# Patient Record
Sex: Female | Born: 1950 | Race: White | Hispanic: No | Marital: Single | Smoking: Never smoker
Health system: Southern US, Community
[De-identification: ages and names within clinical notes are randomized; demographics above are authoritative.]

## PROBLEM LIST (undated history)

## (undated) DIAGNOSIS — I10 Essential (primary) hypertension: Secondary | ICD-10-CM

## (undated) DIAGNOSIS — E7800 Pure hypercholesterolemia, unspecified: Secondary | ICD-10-CM

## (undated) DIAGNOSIS — E0590 Thyrotoxicosis, unspecified without thyrotoxic crisis or storm: Secondary | ICD-10-CM

## (undated) HISTORY — PX: OOPHORECTOMY: SHX86

## (undated) HISTORY — PX: REPLACEMENT TOTAL KNEE BILATERAL: SUR1225

## (undated) HISTORY — PX: CATARACT EXTRACTION: SUR2

---

## 2021-07-25 ENCOUNTER — Other Ambulatory Visit (HOSPITAL_COMMUNITY): Payer: Self-pay | Admitting: Orthopedic Surgery

## 2021-07-25 ENCOUNTER — Other Ambulatory Visit: Payer: Self-pay | Admitting: Orthopedic Surgery

## 2021-07-25 DIAGNOSIS — Z96652 Presence of left artificial knee joint: Secondary | ICD-10-CM

## 2021-07-26 ENCOUNTER — Ambulatory Visit (HOSPITAL_COMMUNITY)
Admission: RE | Admit: 2021-07-26 | Discharge: 2021-07-26 | Disposition: A | Discharge status: Home / Self Care | Payer: Medicare Other | Source: Ambulatory Visit | Attending: Orthopedic Surgery | Admitting: Orthopedic Surgery

## 2021-07-26 ENCOUNTER — Other Ambulatory Visit (HOSPITAL_COMMUNITY): Payer: Self-pay | Admitting: Orthopedic Surgery

## 2021-07-26 DIAGNOSIS — M7989 Other specified soft tissue disorders: Secondary | ICD-10-CM | POA: Diagnosis present

## 2021-07-26 DIAGNOSIS — Z96652 Presence of left artificial knee joint: Secondary | ICD-10-CM

## 2021-07-26 DIAGNOSIS — M79605 Pain in left leg: Secondary | ICD-10-CM

## 2021-07-31 ENCOUNTER — Other Ambulatory Visit (HOSPITAL_COMMUNITY): Payer: Self-pay

## 2021-10-26 ENCOUNTER — Other Ambulatory Visit: Payer: Self-pay | Admitting: Orthopedic Surgery

## 2021-10-26 ENCOUNTER — Other Ambulatory Visit (HOSPITAL_COMMUNITY): Payer: Self-pay | Admitting: Orthopedic Surgery

## 2021-10-26 DIAGNOSIS — M7989 Other specified soft tissue disorders: Secondary | ICD-10-CM

## 2021-10-27 ENCOUNTER — Ambulatory Visit (HOSPITAL_COMMUNITY)
Admission: RE | Admit: 2021-10-27 | Discharge: 2021-10-27 | Disposition: A | Discharge status: Home / Self Care | Payer: Medicare Other | Source: Ambulatory Visit | Attending: Orthopedic Surgery | Admitting: Orthopedic Surgery

## 2021-10-27 DIAGNOSIS — M7989 Other specified soft tissue disorders: Secondary | ICD-10-CM | POA: Diagnosis present

## 2021-10-31 ENCOUNTER — Ambulatory Visit (HOSPITAL_COMMUNITY): Payer: Medicare Other

## 2021-12-25 ENCOUNTER — Ambulatory Visit: Payer: Medicare Other

## 2022-08-27 ENCOUNTER — Ambulatory Visit
Admission: RE | Admit: 2022-08-27 | Discharge: 2022-08-27 | Disposition: A | Discharge status: Home / Self Care | Payer: Medicare Other | Source: Ambulatory Visit

## 2022-08-27 VITALS — BP 133/84 | HR 87 | Temp 99.5°F | Resp 20

## 2022-08-27 DIAGNOSIS — J069 Acute upper respiratory infection, unspecified: Secondary | ICD-10-CM | POA: Diagnosis present

## 2022-08-27 DIAGNOSIS — Z1152 Encounter for screening for COVID-19: Secondary | ICD-10-CM | POA: Insufficient documentation

## 2022-08-27 HISTORY — DX: Essential (primary) hypertension: I10

## 2022-08-27 HISTORY — DX: Pure hypercholesterolemia, unspecified: E78.00

## 2022-08-27 HISTORY — DX: Thyrotoxicosis, unspecified without thyrotoxic crisis or storm: E05.90

## 2022-08-27 MED ORDER — AZITHROMYCIN 250 MG PO TABS: ORAL_TABLET | ORAL | 0 refills | Status: DC

## 2022-08-27 MED ORDER — PROMETHAZINE-DM 6.25-15 MG/5ML PO SYRP: 5.0000 mL | ORAL_SOLUTION | Freq: Four times a day (QID) | ORAL | 0 refills | Status: DC | PRN

## 2022-08-27 NOTE — Discharge Instructions (Signed)
We have tested you for COVID today, this should be back in the morning and someone will call if you are positive.  Use Flonase nasal spray twice daily, saline sinus rinses, Mucinex and over-the-counter cold and congestion medications.  I have sent over a good cough syrup for you.  If you are worsening over the next 4 to 5 days you may start the antibiotic that I have sent additionally.  Follow-up for significantly worsening symptoms.

## 2022-08-27 NOTE — ED Provider Notes (Signed)
[facility]    CSN: [id number] Arrival date & time: 08/27/22  1050      History   Chief Complaint Chief Complaint  Patient presents with   Cough    I think I probably have a sinus infection. I've had a cough for several days with sore throat and sinus drainage. - Entered by patient    HPI Melissa Dillon is a 72 y.o. female.   Patient presenting today with several weeks of cough that is now progressing into 3 days of sore throat, postnasal drainage, fatigue, more of a wet cough.  Denies fever, chills, chest pain, shortness of breath, abdominal pain, nausea vomiting or diarrhea.  Tried DayQuil with minimal relief so far.  Multiple sick contacts with similar symptoms who now have pneumonia.  No known chronic pulmonary disease.    Past Medical History:  Diagnosis Date   High cholesterol    Hypertension    Hyperthyroidism determined by thyroid function test     There are no problems to display for this patient.   History reviewed. No pertinent surgical history.  OB History   No obstetric history on file.      Home Medications    Prior to Admission medications   Medication Sig Start Date End Date Taking? Authorizing Provider  azithromycin (ZITHROMAX) 250 MG tablet Take first 2 tablets together, then 1 every day until finished. 08/27/22  Yes Particia Nearing, PA-C  buPROPion (WELLBUTRIN XL) 300 MG 24 hr tablet Take by mouth. 04/25/11  Yes [provider]  latanoprost (XALATAN) 0.005 % ophthalmic solution INSTILL 1 DROP IN [facility] EVERY NIGHT AT BEDTIME 12/05/20  Yes [provider]  Lifitegrast Benay Spice) 5 % SOLN Apply to eye. 03/12/18  Yes [provider]  loratadine (CLARITIN) 10 MG tablet Take by mouth. 04/30/11  Yes [provider]  omeprazole (PRILOSEC) 40 MG capsule  11/10/20  Yes [provider]  promethazine-dextromethorphan (PROMETHAZINE-DM) 6.25-15 MG/5ML syrup Take 5 mLs by mouth 4 (four) times daily as  needed. 08/27/22  Yes Particia Nearing, PA-C  lisinopril (ZESTRIL) 10 MG tablet Take 10 mg by mouth daily.    [provider]  Olopatadine HCl 0.2 % SOLN one drop daily.    [provider]  rosuvastatin (CRESTOR) 10 MG tablet Take 1 tablet by mouth daily.    [provider]  rosuvastatin (CRESTOR) 20 MG tablet Take by mouth.    [provider]    Family History History reviewed. No pertinent family history.  Social History Social History   Tobacco Use   Smoking status: Never   Smokeless tobacco: Never  Vaping Use   Vaping Use: Former  Substance Use Topics   Alcohol use: Yes     Allergies   Clonidine derivatives and Codeine   Review of Systems Review of Systems HPI  Physical Exam Triage Vital Signs ED Triage Vitals  Enc Vitals Group     BP 08/27/22 1122 133/84     Pulse Rate 08/27/22 1122 87     Resp 08/27/22 1122 20     Temp 08/27/22 1122 99.5 F (37.5 C)     Temp Source 08/27/22 1122 Oral     SpO2 08/27/22 1122 95 %     Weight --      Height --      Head Circumference --      Peak Flow --      Pain Score 08/27/22 1125 0     Pain  Loc --      Pain Edu? --      Excl. in GC? --    No data found.  Updated Vital Signs BP 133/84 (BP Location: Right Arm)   Pulse 87   Temp 99.5 F (37.5 C) (Oral)   Resp 20   SpO2 95%   Visual Acuity Right Eye Distance:   Left Eye Distance:   Bilateral Distance:    Right Eye Near:   Left Eye Near:    Bilateral Near:     Physical Exam Vitals and nursing note reviewed.  Constitutional:      Appearance: Normal appearance.  HENT:     Head: Atraumatic.     Right Ear: Tympanic membrane and external ear normal.     Left Ear: Tympanic membrane and external ear normal.     Nose: Congestion present.     Mouth/Throat:     Mouth: Mucous membranes are moist.     Pharynx: Posterior oropharyngeal erythema present.  Eyes:     Extraocular Movements: Extraocular movements intact.      Conjunctiva/sclera: Conjunctivae normal.  Cardiovascular:     Rate and Rhythm: Normal rate and regular rhythm.     Heart sounds: Normal heart sounds.  Pulmonary:     Effort: Pulmonary effort is normal.     Breath sounds: Normal breath sounds. No wheezing or rales.  Musculoskeletal:        General: Normal range of motion.     Cervical back: Normal range of motion and neck supple.  Skin:    General: Skin is warm and dry.  Neurological:     Mental Status: She is alert and oriented to person, place, and time.  Psychiatric:        Mood and Affect: Mood normal.        Thought Content: Thought content normal.      UC Treatments / Results  Labs (all labs ordered are listed, but only abnormal results are displayed) Labs Reviewed  SARS CORONAVIRUS 2 (TAT 6-24 HRS)    EKG   Radiology No results found.  Procedures Procedures (including critical care time)  Medications Ordered in UC Medications - No data to display  Initial Impression / Assessment and Plan / UC Course  I have reviewed the triage vital signs and the nursing notes.  Pertinent labs & imaging results that were available during my care of the patient were reviewed by me and considered in my medical decision making (see chart for details).     Suspect viral upper respiratory infection in addition to ongoing cough which patient thinks is more related to her lisinopril.  COVID testing pending, will treat with Phenergan DM, DayQuil, NyQuil, Mucinex and other supportive measures while awaiting these results and adjust if needed.  Zithromax sent in case worsening as she is very concerned about pneumonia.  Return for worsening symptoms.  Final Clinical Impressions(s) / UC Diagnoses   Final diagnoses:  Upper respiratory tract infection, unspecified type     Discharge Instructions      We have tested you for COVID today, this should be back in the morning and someone will call if you are positive.  Use Flonase nasal  spray twice daily, saline sinus rinses, Mucinex and over-the-counter cold and congestion medications.  I have sent over a good cough syrup for you.  If you are worsening over the next 4 to 5 days you may start the antibiotic that I have sent additionally.  Follow-up for significantly  worsening symptoms.    ED Prescriptions     Medication Sig Dispense Auth. Provider   azithromycin (ZITHROMAX) 250 MG tablet Take first 2 tablets together, then 1 every day until finished. 6 tablet Particia Nearing, [place]   promethazine-dextromethorphan (PROMETHAZINE-DM) 6.25-15 MG/5ML syrup Take 5 mLs by mouth 4 (four) times daily as needed. 100 mL Particia Nearing, [place]      PDMP not reviewed this encounter.   Particia Nearing, [place] 08/27/22 1201

## 2022-08-27 NOTE — ED Triage Notes (Signed)
Pt reports she has had a cough x 2 weeks sore throat, drainage, and fatigue x 3 days.   Pt is on lisinopril.  Took nyquil

## 2022-08-28 LAB — SARS CORONAVIRUS 2 (TAT 6-24 HRS): SARS Coronavirus 2: NEGATIVE

## 2023-04-17 IMAGING — US US EXTREM LOW VENOUS*L*
1 series · 13 of 24 positions shown · non-contrast
Comparison: None Available.

CLINICAL DATA: Left lower extremity swelling 8 week status post
left total knee arthroplasty



[Series 1: us venous img lower uni left (dvt) · portal-venous · 13 of 44 slices shown]
[im 1/44]
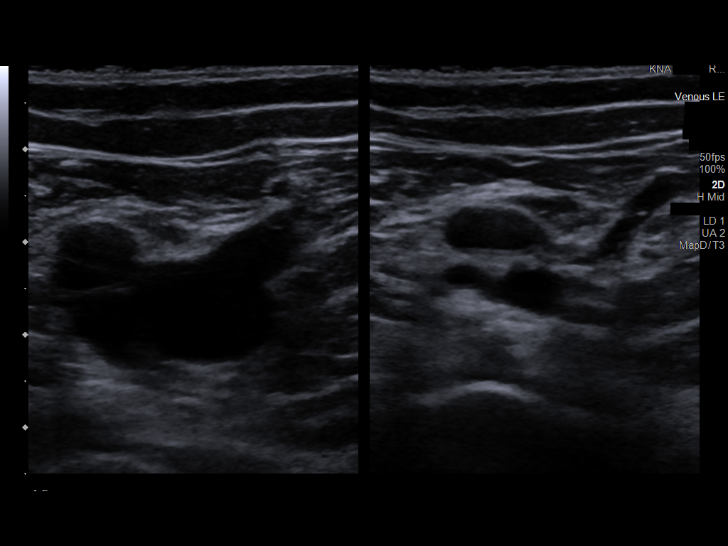
[im 4/44]
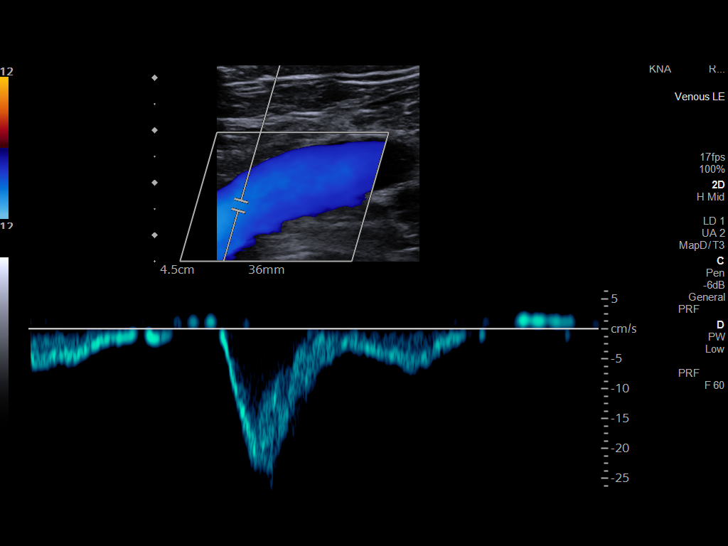
[im 8/44]
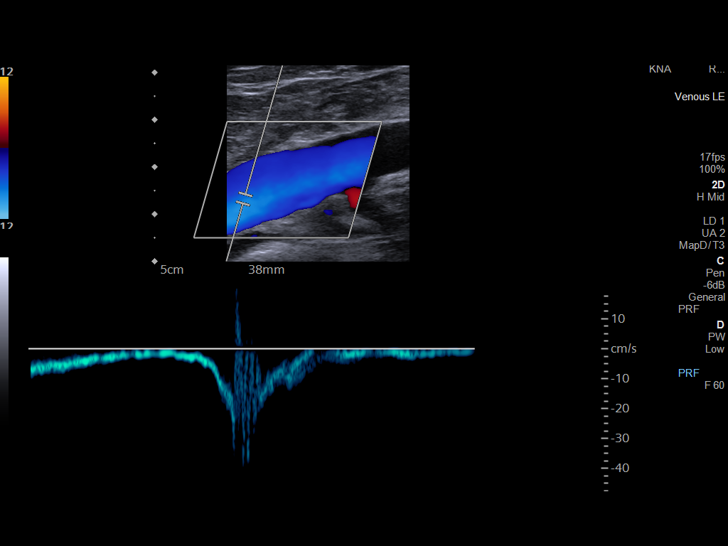
[im 12/44]
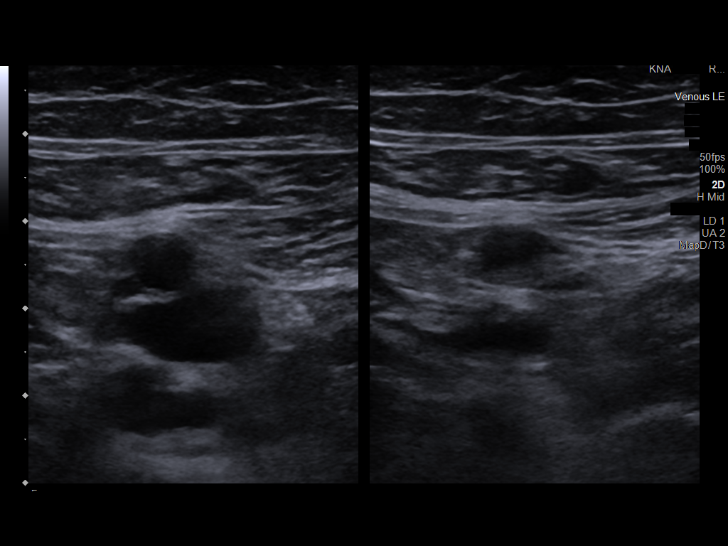
[im 15/44]
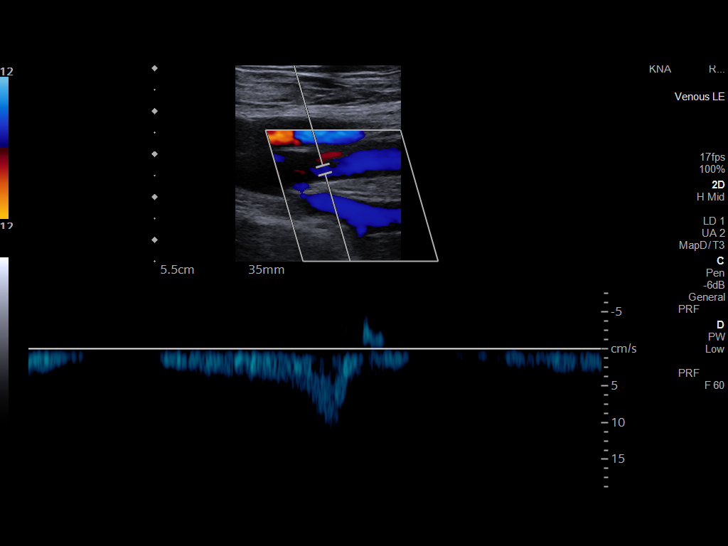
[im 19/44]
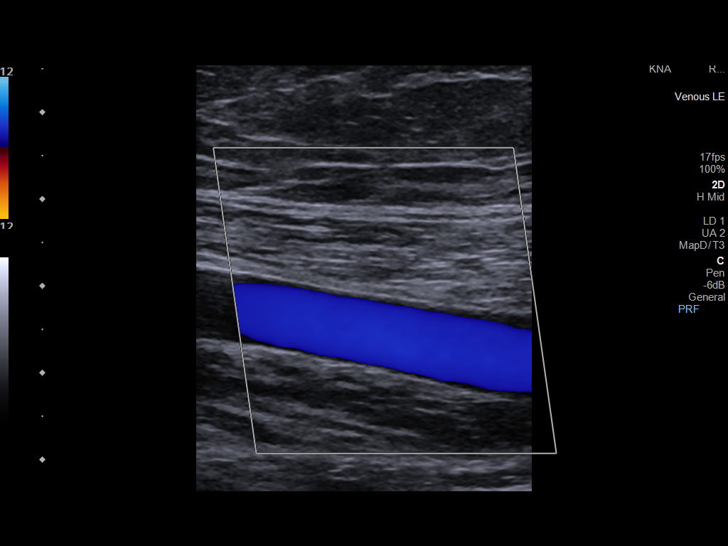
[im 23/44]
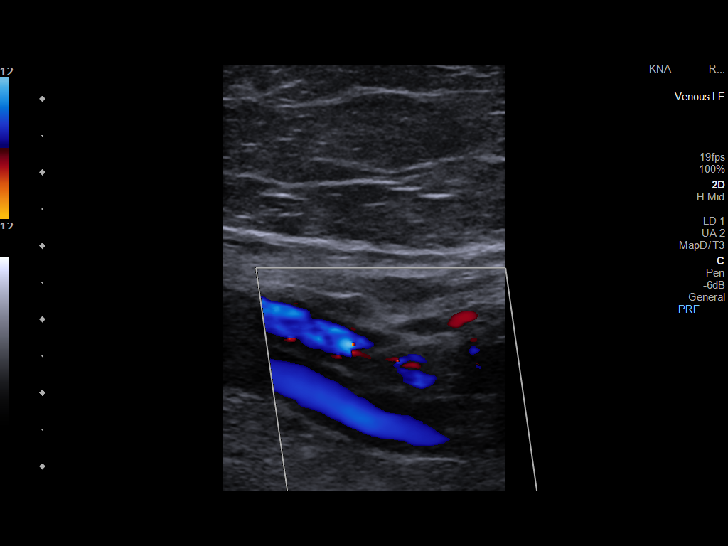
[im 25/44]
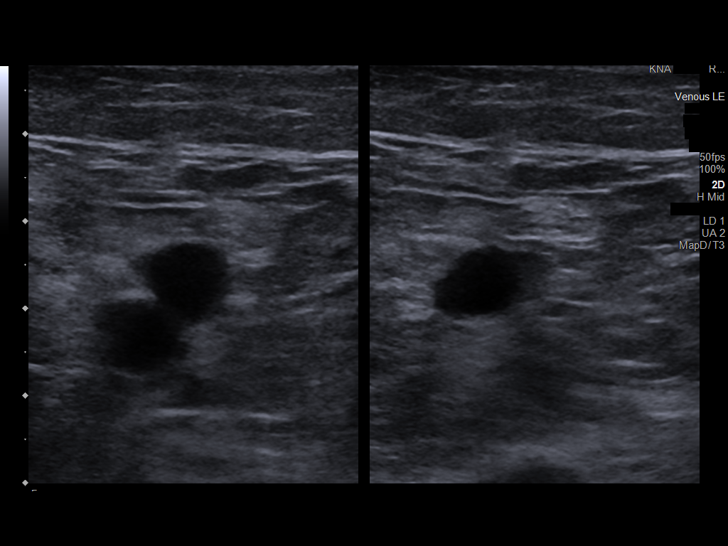
[im 29/44]
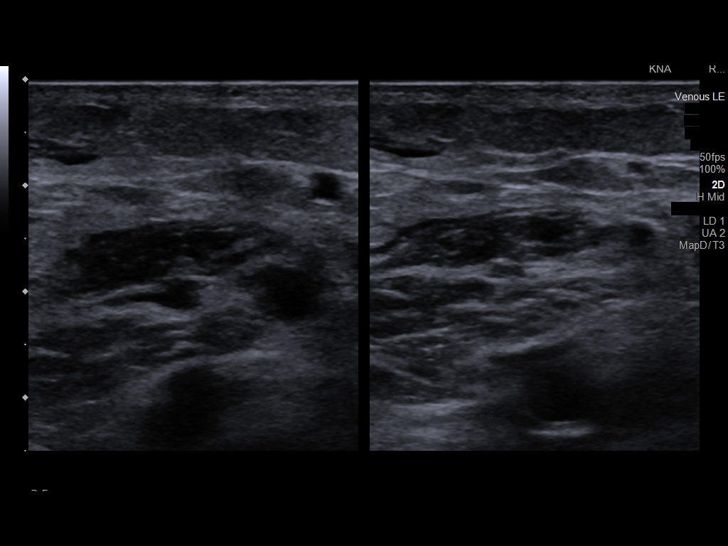
[im 32/44]
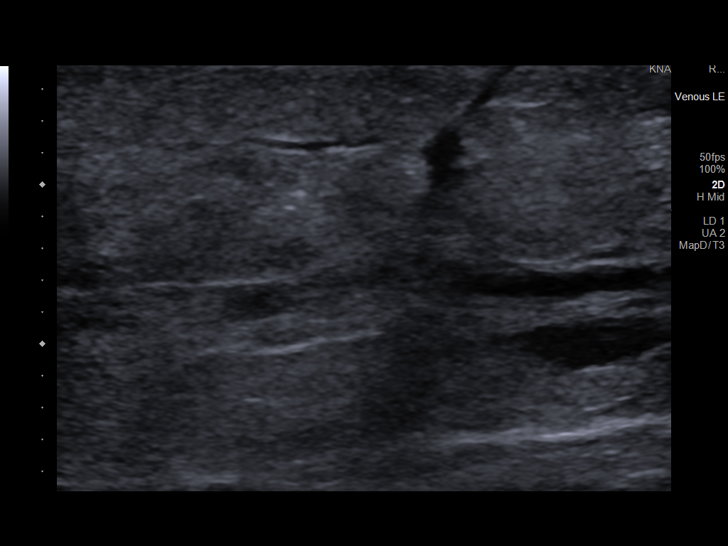
[im 36/44]
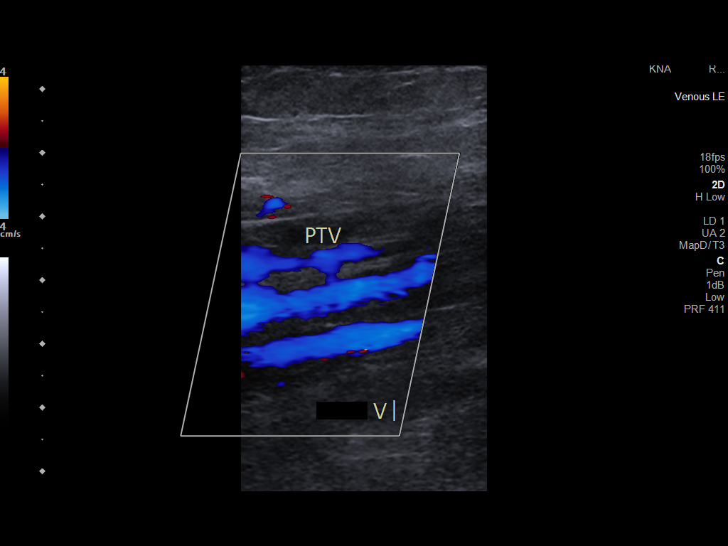
[im 40/44]
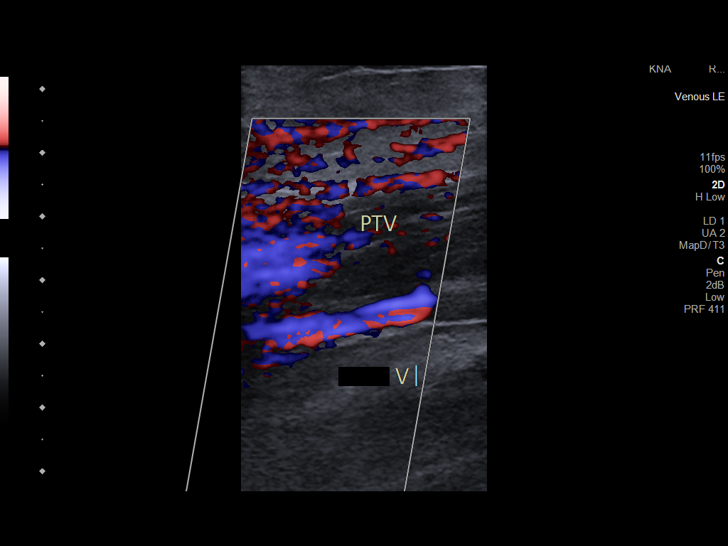
[im 44/44]
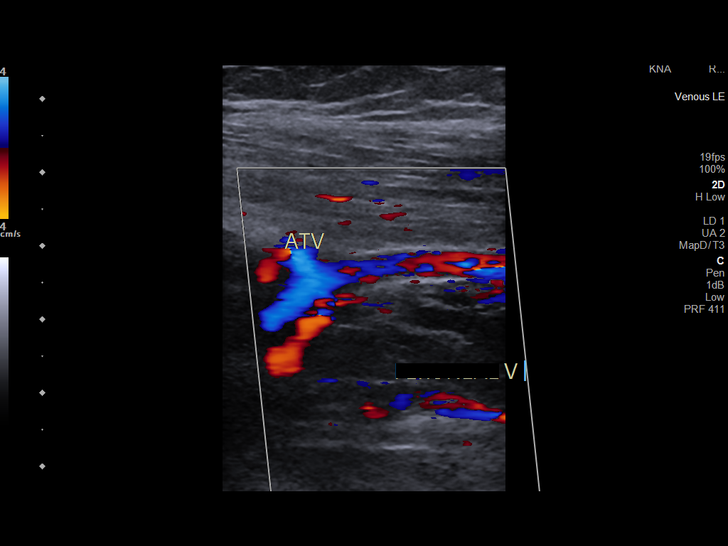

[13 of 24 positions shown; findings below may reference images not displayed]

FINDINGS: Contralateral Common Femoral Vein: Respiratory phasicity is normal
and symmetric with the symptomatic side. No evidence of thrombus.
Normal compressibility.

Common Femoral Vein: No evidence of thrombus. Normal
compressibility, respiratory phasicity and response to augmentation.

Saphenofemoral Junction: No evidence of thrombus. Normal
compressibility and flow on color Doppler imaging.

Profunda Femoral Vein: No evidence of thrombus. Normal
compressibility and flow on color Doppler imaging.

Femoral Vein: No evidence of thrombus. Normal compressibility,
respiratory phasicity and response to augmentation.

Popliteal Vein: No evidence of thrombus. Normal compressibility,
respiratory phasicity and response to augmentation.

Calf Veins: Posterior tibial veins are patent and compressible.
However, 1 of the paired peroneal veins this not appear compressible
and lumen is expanded and contains low level internal echoes without
evidence of color flow on color Doppler imaging. Findings are
consistent with isolated calf DVT.

Superficial Great Saphenous Vein: No evidence of thrombus. Normal
compressibility.

Venous Reflux:  None.

Other Findings:  None.
IMPRESSION: Positive for isolated calf DVT involving 1 of the paired peroneal
veins.

## 2023-07-22 ENCOUNTER — Encounter: Payer: Self-pay | Admitting: Cardiology

## 2023-07-22 ENCOUNTER — Ambulatory Visit: Attending: Cardiology | Admitting: Cardiology

## 2023-07-22 VITALS — BP 118/76 | Ht 63.0 in | Wt 219.4 lb

## 2023-07-22 DIAGNOSIS — R0609 Other forms of dyspnea: Secondary | ICD-10-CM | POA: Insufficient documentation

## 2023-07-22 DIAGNOSIS — E782 Mixed hyperlipidemia: Secondary | ICD-10-CM | POA: Diagnosis not present

## 2023-07-22 DIAGNOSIS — I499 Cardiac arrhythmia, unspecified: Secondary | ICD-10-CM | POA: Insufficient documentation

## 2023-07-22 NOTE — Patient Instructions (Addendum)
 Medication Instructions:  STOP Eliquis   *If you need a refill on your cardiac medications before your next appointment, please call your pharmacy*  Lab Work: Lipid panel after 30 day monitor   If you have labs (blood work) drawn today and your tests are completely normal, you will receive your results only by: MyChart Message (if you have MyChart) OR A paper copy in the mail If you have any lab test that is abnormal or we need to change your treatment, we will call you to review the results.  Testing/Procedures: Echo after 30 day monitor   Your physician has requested that you have an echocardiogram. Echocardiography is a painless test that uses sound waves to create images of your heart. It provides your doctor with information about the size and shape of your heart and how well your heart's chambers and valves are working. This procedure takes approximately one hour. There are no restrictions for this procedure. Please do NOT wear cologne, perfume, aftershave, or lotions (deodorant is allowed). Please arrive 15 minutes prior to your appointment time.  Please note: We ask at that you not bring children with you during ultrasound (echo/ vascular) testing. Due to room size and safety concerns, children are not allowed in the ultrasound rooms during exams. Our front office staff cannot provide observation of children in our lobby area while testing is being conducted. An adult accompanying a patient to their appointment will only be allowed in the ultrasound room at the discretion of the ultrasound technician under special circumstances. We apologize for any inconvenience.   EXERCISE NUCLEAR STRESS TEST AFTER 30 DAY MONITOR   Your physician has requested that you have en exercise stress myoview. For further information please visit [email or web address] Please follow instruction sheet, as given.   30 DAY EVENT MONITOR   Your physician has recommended that you wear an event monitor. Event  monitors are medical devices that record the heart's electrical activity. Doctors most often us  these monitors to diagnose arrhythmias. Arrhythmias are problems with the speed or rhythm of the heartbeat. The monitor is a small, portable device. You can wear one while you do your normal daily activities. This is usually used to diagnose what is causing palpitations/syncope (passing out).   Follow-Up: At [facility], you and your health needs are our priority.  As part of our continuing mission to provide you with exceptional heart care, our providers are all part of one team.  This team includes your primary Cardiologist (physician) and Advanced Practice Providers or APPs (Physician Assistants and Nurse Practitioners) who all work together to provide you with the care you need, when you need it.  Your next appointment:   3 month(s)  Provider:   One of our Advanced Practice Providers (APPs): Melita Springer, PA-C  Friddie Jetty, NP Evaline Hill, NP  Theotis Flake, PA-C Lawana Pray, NP  Willis Harter, PA-C Lovette Rud, PA-C  [place], [place] Charles Connor, NP  Marlana Silvan, NP Marcie Sever, PA-C  Laquita Plant, PA-C    Dayna Dunn, PA-C  Marlyse Single, PA-C Palmer Bobo, NP Katlyn West, NP Callie Goodrich, PA-C  Evan Williams, PA-C Sheng Haley, PA-C  Xika Zhao, NP Kathleen Johnson, PA-C

## 2023-07-22 NOTE — Progress Notes (Signed)
 Cardiology Office Note:  .   Date:  07/22/2023  ID:  Melissa Dillon, DOB 1951-02-03, MRN [id number] PCP: Pcp, No  [facility] Providers Cardiologist:  Fransico Ivy, MD PCP: Pcp, No  Chief Complaint  Patient presents with   Irregular Heart Beat     Melissa Dillon is a 73 y.o. female with hyperlipidemia, OSA on CPAP, irregular heartbeat, exertional dyspnea  Discussed the use of AI scribe software for clinical note transcription with the patient, who gave verbal consent to proceed.  History of Present Illness Melissa Dillon is a 73 year old female with concerns about AFib detected on her Fitbit. She was referred by her primary care physician for evaluation of atrial fibrillation.  Episodes of atrial fibrillation have been detected on her Fitbit on three separate nights over the past month, with no associated symptoms such as chest pain, shortness of breath, heart racing, or palpitations. She has not had atrial fibrillation confirmed on an EKG. She experiences intermittent shortness of breath, primarily when climbing stairs, which she attributes to being overweight. She is currently taking Eliquis, having started four doses ago after receiving a sample from her primary care physician.  Her family history includes significant heart disease, with her father having had atrial fibrillation, triple bypass surgery, a major stroke, and heart failure. Her sister also has atrial fibrillation.  She uses a CPAP machine regularly for sleep apnea. She consumes coffee daily and alcohol once or twice a week, but does not smoke.      Vitals:   07/22/23 0959 07/22/23 1000  BP: 124/78 118/76  SpO2: 98%       Review of Systems  Cardiovascular:  Positive for dyspnea on exertion. Negative for chest pain, leg swelling, palpitations and syncope.        Studies Reviewed: Melissa Dillon        EKG 07/22/2023: Normal sinus rhythm w/occasional PAC Borderline ECG No previous ECGs available     Independently interpreted 06/2023: Cr 0.83  11/2022: Chol 207, TG 158, HDL 65, LDL 115 Hb 15  Physical Exam Vitals and nursing note reviewed.  Constitutional:      General: She is not in acute distress. Neck:     Vascular: No JVD.  Cardiovascular:     Rate and Rhythm: Normal rate and regular rhythm.     Heart sounds: Normal heart sounds. No murmur heard. Pulmonary:     Effort: Pulmonary effort is normal.     Breath sounds: Normal breath sounds. No wheezing or rales.  Musculoskeletal:     Right lower leg: No edema.     Left lower leg: No edema.      VISIT DIAGNOSES:   ICD-10-CM   1. Irregular heart beat  I49.9 EKG 12-Lead    Cardiac event monitor    2. Exertional dyspnea  R06.09 ECHOCARDIOGRAM COMPLETE    Myocardial Perfusion Imaging    Cardiac Stress Test: Informed Consent Details: Physician/Practitioner Attestation; Transcribe to consent form and obtain patient signature    3. Mixed hyperlipidemia  E78.2 Lipid panel    Lipid panel       Melissa Dillon is a 73 y.o. female with hyperlipidemia, OSA on CPAP, irregular heartbeat, exertional dyspnea Assessment & Plan Irregular heartbeat: Reported A-fib on Fitbit, not confirmed on EKG. Possible Fitbit misinterpretation due to extra beat. Moderate stroke risk due to age and gender, so she will need anticoagulation with Eliquis if AFib confirmed.  However, in absence of EKG evidence of A-fib, hold Eliquis for now. -  Provide 30-day event monitor for accurate AFib detection. - Order echocardiogram to assess cardiac function. - Order stress test to evaluate for coronary artery disease. - Continue walking, aim for 10,000 steps most days.  Obstructive sleep apnea Continue CPAP therapy.  Mixed hyperlipidemia: Cholesterol slightly elevated. On rosuvastatin 20 mg for four years. Dose increase may improve management. - Increase Crestor to 40 mg. - Check cholesterol levels in July, preferably fasting.  Follow-up: Plan for  monitoring and further testing discussed. - Start monitor today for 30 days. - Return monitor in July, perform echocardiogram, stress test, and lab work at the same time. - Schedule follow-up appointment in three months, around September.      Informed Consent   Shared Decision Making/Informed Consent The risks [chest pain, shortness of breath, cardiac arrhythmias, dizziness, blood pressure fluctuations, myocardial infarction, stroke/transient ischemic attack, nausea, vomiting, allergic reaction, radiation exposure, metallic taste sensation and life-threatening complications (estimated to be 1 in 10,000)], benefits (risk stratification, diagnosing coronary artery disease, treatment guidance) and alternatives of a nuclear stress test were discussed in detail with Melissa Dillon and she agrees to proceed.      \  F/u in 3 months  Signed, Cody Das, MD

## 2023-07-23 ENCOUNTER — Encounter: Payer: Self-pay | Admitting: Cardiology

## 2023-08-13 ENCOUNTER — Other Ambulatory Visit: Payer: Self-pay | Admitting: Cardiology

## 2023-08-13 DIAGNOSIS — R0609 Other forms of dyspnea: Secondary | ICD-10-CM

## 2023-08-26 ENCOUNTER — Telehealth: Payer: Self-pay | Admitting: *Deleted

## 2023-08-26 MED ORDER — APIXABAN 5 MG PO TABS: 5.0000 mg | ORAL_TABLET | Freq: Two times a day (BID) | ORAL | 0 refills | Status: AC

## 2023-08-26 MED ORDER — APIXABAN 5 MG PO TABS: 5.0000 mg | ORAL_TABLET | Freq: Two times a day (BID) | ORAL | 3 refills | Status: AC

## 2023-08-26 NOTE — Addendum Note (Signed)
 Addended by: GRETEL MAEOLA CROME on: 08/26/2023 10:27 AM   Modules accepted: Orders

## 2023-08-26 NOTE — Telephone Encounter (Signed)
   Cardiac Monitor Alert  Date of alert:  08/26/2023   Patient Name: Melissa Dillon  DOB: April 09, 1950  MRN: [id number]   [facility] Cardiologist: Newman JINNY Lawrence, MD  [facility] EP:  None    Monitor Information: Cardiac Event Monitor [[employer]]  Reason:  Irregular heart beat/palpitations Ordering provider:  Patwardhan   Alert Atrial Fibrillation/Flutter This is the 1st alert for this rhythm.  (Day 23 of 30) The patient has no hx of Atrial Fibrillation/Flutter.  The patient is not currently on anticoagulation.  Next Cardiology Appointment   Date:  9/3  Provider:  Lelon  The patient was contacted today.  She is asymptomatic. Arrhythmia, symptoms and history reviewed with Dr. Lawrence.  Plan:  complete monitor in the next couple days, restart Eliquis  (she will stop by the office tomorrow for 30 day free card, she will use samples she has at home until she can pick up card/Rx)  Other:   Gretel Maeola CROME, RN  08/26/2023 9:30 AM

## 2023-08-27 ENCOUNTER — Telehealth (HOSPITAL_COMMUNITY): Payer: Self-pay | Admitting: *Deleted

## 2023-08-27 NOTE — Telephone Encounter (Signed)
 Patient given detailed instructions per Myocardial Perfusion Study Information Sheet for the test on 09/04/23 Patient notified to arrive 15 minutes early and that it is imperative to arrive on time for appointment to keep from having the test rescheduled.  If you need to cancel or reschedule your appointment, please call the office within 24 hours of your appointment. . Patient verbalized understanding. Melissa Dillon

## 2023-08-28 ENCOUNTER — Encounter: Payer: Self-pay | Admitting: Cardiology

## 2023-08-30 ENCOUNTER — Ambulatory Visit: Attending: Cardiology

## 2023-08-30 DIAGNOSIS — I499 Cardiac arrhythmia, unspecified: Secondary | ICD-10-CM

## 2023-09-04 ENCOUNTER — Ambulatory Visit (HOSPITAL_COMMUNITY)
Admission: RE | Admit: 2023-09-04 | Discharge: 2023-09-04 | Disposition: A | Discharge status: Home / Self Care | Source: Ambulatory Visit | Attending: Cardiology | Admitting: Cardiology

## 2023-09-04 ENCOUNTER — Ambulatory Visit (HOSPITAL_COMMUNITY)
Admission: RE | Admit: 2023-09-04 | Discharge: 2023-09-04 | Disposition: A | Discharge status: Home / Self Care | Source: Ambulatory Visit | Attending: Cardiovascular Disease | Admitting: Cardiovascular Disease

## 2023-09-04 DIAGNOSIS — R0609 Other forms of dyspnea: Secondary | ICD-10-CM | POA: Diagnosis present

## 2023-09-04 LAB — MYOCARDIAL PERFUSION IMAGING
Angina Index: 0
Base ST Depression (mm): 0 mm
Duke Treadmill Score: 4
Estimated workload: 5.8
Exercise duration (min): 4 min
Exercise duration (sec): 0 s
LV dias vol: 65 mL (ref 46–106)
LV sys vol: 8 mL (ref 3.8–5.2)
MPHR: 149 {beats}/min
Nuc Stress EF: 88 %
Peak HR: 150 {beats}/min
Percent HR: 100 %
Rest HR: 77 {beats}/min
Rest Nuclear Isotope Dose: 10.9 mCi
SDS: 0
SRS: 0
SSS: 0
ST Depression (mm): 0 mm
Stress Nuclear Isotope Dose: 31.8 mCi
TID: 0.88

## 2023-09-04 LAB — ECHOCARDIOGRAM COMPLETE
Area-P 1/2: 3.4 cm2
S' Lateral: 2.68 cm

## 2023-09-04 MED ORDER — TECHNETIUM TC 99M TETROFOSMIN IV KIT
10.9000 | PACK | Freq: Once | INTRAVENOUS | Status: AC | PRN
  Administered 2023-09-04: 10.9 via INTRAVENOUS

## 2023-09-04 MED ORDER — TECHNETIUM TC 99M TETROFOSMIN IV KIT
31.8000 | PACK | Freq: Once | INTRAVENOUS | Status: AC | PRN
  Administered 2023-09-04: 31.8 via INTRAVENOUS

## 2023-09-07 ENCOUNTER — Ambulatory Visit: Payer: Self-pay | Admitting: Cardiology

## 2023-09-08 DIAGNOSIS — I491 Atrial premature depolarization: Secondary | ICD-10-CM

## 2023-09-08 DIAGNOSIS — I499 Cardiac arrhythmia, unspecified: Secondary | ICD-10-CM | POA: Diagnosis not present

## 2023-09-08 NOTE — Progress Notes (Signed)
 Also noted to have ventricular tachycardia. Normal echocardiogram and stress test with no significant abnormalities. Recommend adding metoprolol succinate 25 mg daily. Happy to discuss in person.  Thanks MJP

## 2023-09-08 NOTE — Progress Notes (Signed)
 1% Afib noted. Continue Eliquis . Current appt w/Scott Lelon in September. Happy to see sooner to discuss Afib management.  Thanks MJP

## 2023-09-10 NOTE — Telephone Encounter (Signed)
 Appt moved up and message sent to provider concerning metoprolol dose. Please review result note.

## 2023-09-13 ENCOUNTER — Encounter: Payer: Self-pay | Admitting: Cardiology

## 2023-09-13 MED ORDER — METOPROLOL SUCCINATE ER 25 MG PO TB24: 12.5000 mg | ORAL_TABLET | Freq: Every day | ORAL | 3 refills | Status: AC

## 2023-09-13 NOTE — Telephone Encounter (Signed)
 Spoke with patient and advised that per Dr. Elmira to start metoprolol succinate 12.5 mg daily. Pt agrees with plan of care.

## 2023-09-19 ENCOUNTER — Other Ambulatory Visit (HOSPITAL_BASED_OUTPATIENT_CLINIC_OR_DEPARTMENT_OTHER): Payer: Self-pay

## 2023-09-19 MED ORDER — BOOSTRIX 5-2.5-18.5 LF-MCG/0.5 IM SUSY
0.5000 mL | PREFILLED_SYRINGE | Freq: Once | INTRAMUSCULAR | 0 refills | Status: AC
  Filled 2023-09-19: qty 0.5, 1d supply, fill #0

## 2023-09-20 ENCOUNTER — Encounter: Payer: Self-pay | Admitting: Cardiology

## 2023-09-20 ENCOUNTER — Ambulatory Visit: Attending: Cardiology | Admitting: Cardiology

## 2023-09-20 VITALS — BP 108/65 | HR 71 | Ht 63.0 in | Wt 218.0 lb

## 2023-09-20 DIAGNOSIS — E669 Obesity, unspecified: Secondary | ICD-10-CM

## 2023-09-20 DIAGNOSIS — G4733 Obstructive sleep apnea (adult) (pediatric): Secondary | ICD-10-CM | POA: Diagnosis not present

## 2023-09-20 DIAGNOSIS — E782 Mixed hyperlipidemia: Secondary | ICD-10-CM

## 2023-09-20 DIAGNOSIS — I480 Paroxysmal atrial fibrillation: Secondary | ICD-10-CM

## 2023-09-20 NOTE — Progress Notes (Signed)
 Cardiology Office Note:  .   Date:  09/20/2023  ID:  Melissa Dillon, DOB 1950/05/22, MRN [id number] PCP: Pcp, No  [facility] Providers Cardiologist:  Newman Lawrence, MD PCP: Pcp, No  Chief Complaint  Patient presents with   PAF     Melissa Dillon is a 73 y.o. female with hyperlipidemia, obesity, OSA on CPAP, PAF, AIVR, NSVT  History of Present Illness Reviewed recent echocardiogram stress test, monitor results with the patient, details below. She does not have any clear symptoms of palpitations, presyncope or sycope. AIVR episode occurred during sleep hours. She has OSA, uses CPAP. She is taking eliquis  5 mg bid and metoprolol  succinate 12.5 mg daily without any side effects.    Vitals:   09/20/23 1115  BP: 108/65  Pulse: 71  SpO2: 95%       Review of Systems  Cardiovascular:  Negative for chest pain, dyspnea on exertion, leg swelling, palpitations and syncope.        Studies Reviewed: SABRA        EKG 07/22/2023: Normal sinus rhythm w/occasional PAC Borderline ECG No previous ECGs available    Echocardiogram 08/2023:  1. Left ventricular ejection fraction, by estimation, is 60 to 65%. Left  ventricular ejection fraction by 3D volume is 62 %. The left ventricle has  normal function. The left ventricle has no regional wall motion  abnormalities. Left ventricular diastolic   parameters were normal. The average left ventricular global longitudinal  strain is -19.3 %. The global longitudinal strain is normal.   2. Right ventricular systolic function is normal. The right ventricular  size is normal.   3. The mitral valve is normal in structure. Trivial mitral valve  regurgitation. No evidence of mitral stenosis.   4. The aortic valve is tricuspid. Aortic valve regurgitation is not  visualized. No aortic stenosis is present.   5. The inferior vena cava is normal in size with greater than 50%  respiratory variability, suggesting right atrial pressure of 3  mmHg.   Stress test 08/2023:   A Bruce protocol stress test was performed. The patient experienced no angina during the test. Normal blood pressure and normal heart rate response noted during stress. Heart rate recovery was normal.   No ST deviation was noted. There were no arrhythmias during stress. ECG was interpretable and conclusive. The ECG was negative for ischemia.   LV perfusion is normal. There is no evidence of ischemia. There is no evidence of infarction.   Left ventricular function is normal. Nuclear stress EF: 88%. End diastolic cavity size is normal. End systolic cavity size is normal. No evidence of transient ischemic dilation (TID) noted.   Coronary calcium was present on the attenuation correction CT images. Mild coronary calcifications were present. Coronary calcifications were present in the left circumflex artery distribution(s).   The study is normal. The study is low risk.    Mobile cardiac telemetry 30 days 07/30/2023 - 08/28/2023: Dominant rhythm: Sinus. HR 54-149 bpm. Avg HR 82 bpm. 1% Afib/flutter noted, longest episode for 2 hr 17 min, fastest at 149 bpm. 2 episodes of VT, fastest at 141 bpm, longest for 16 secs (AIVR episode) <1% PVCs. 0 episodes of SVT. <1% PACS. No SVT/high grade AV block, sinus pause >3sec noted. 3 manually detected events correlated with sinus rhythm. Above reported arhythmia did not correlated with patient reported symptoms.  Labs 06/2023: Cr 0.83  11/2022: Chol 207, TG 158, HDL 65, LDL 115 Hb 15  Physical Exam Vitals and  nursing note reviewed.  Constitutional:      General: She is not in acute distress. Neck:     Vascular: No JVD.  Cardiovascular:     Rate and Rhythm: Normal rate and regular rhythm.     Heart sounds: Normal heart sounds. No murmur heard. Pulmonary:     Effort: Pulmonary effort is normal.     Breath sounds: Normal breath sounds. No wheezing or rales.  Musculoskeletal:     Right lower leg: No edema.     Left  lower leg: No edema.      VISIT DIAGNOSES:   ICD-10-CM   1. PAF (paroxysmal atrial fibrillation) (HCC)  I48.0 AMB Referral to [facility] Pharm-D    2. OSA (obstructive sleep apnea)  G47.33 AMB Referral to [facility] Pharm-D    3. Obesity (BMI 30-39.9)  E66.9 AMB Referral to [facility] Pharm-D    4. Mixed hyperlipidemia  E78.2 CT CARDIAC SCORING (SELF PAY ONLY)        Melissa Dillon is a 73 y.o. female with hyperlipidemia, obesity, OSA on CPAP, PAF, AIVR, NSVT Assessment & Plan PAF: 1% Afib burden, no clear symptoms. Recommend rate control approach. Continue metoprolol  succinate 12.5 mg daily. Continue eliquis  5 mg bid.  NSVT/AIVR: NSVT only brief, AIVR episode during sleep hpurs. Likely related to OSA. Continue CPAP.  Obesity: Likely playing a role in her PAF and OSA. Also has hyperlipidemia. Will check calcium score scan to assess for presence of CAD. She will likely benefit from GLP-1 agonists.    F/u in 6 months  Signed, Newman JINNY Lawrence, MD

## 2023-09-20 NOTE — Patient Instructions (Signed)
 Medication Instructions:  Eliquis  patient assistance started   *If you need a refill on your cardiac medications before your next appointment, please call your pharmacy*  Testing/Procedures: CALCIUM SCORE   CT scanning for a cardiac calcium score (CAT scanning), is a noninvasive, special x-ray that produces cross-sectional images of the body using x-rays and a computer. CT scans help physicians diagnose and treat medical conditions. For some CT exams, a contrast material is used to enhance visibility in the area of the body being studied. CT scans provide greater clarity and reveal more details than regular x-ray exams.   Your physician has requested that you have a coronary calcium score performed. This is not covered by insurance and will be an out-of-pocket cost of approximately $99.   REFERRAL PHARM D   Follow-Up: At [facility], you and your health needs are our priority.  As part of our continuing mission to provide you with exceptional heart care, our providers are all part of one team.  This team includes your primary Cardiologist (physician) and Advanced Practice Providers or APPs (Physician Assistants and Nurse Practitioners) who all work together to provide you with the care you need, when you need it.  Your next appointment:   6 month(s)  Provider:   Newman JINNY Lawrence, MD

## 2023-10-07 ENCOUNTER — Ambulatory Visit (HOSPITAL_COMMUNITY)
Admission: RE | Admit: 2023-10-07 | Discharge: 2023-10-07 | Disposition: A | Discharge status: Home / Self Care | Payer: Self-pay | Source: Ambulatory Visit | Attending: Cardiology | Admitting: Cardiology

## 2023-10-07 DIAGNOSIS — E782 Mixed hyperlipidemia: Secondary | ICD-10-CM | POA: Insufficient documentation

## 2023-10-10 ENCOUNTER — Ambulatory Visit: Payer: Self-pay | Admitting: Cardiology

## 2023-10-10 DIAGNOSIS — E782 Mixed hyperlipidemia: Secondary | ICD-10-CM

## 2023-10-10 NOTE — Progress Notes (Signed)
 Recommend Crestor 20 mg daily to reduce progression of coronary artery disease and risk of heart attack.  Thanks MJP

## 2023-10-11 NOTE — Progress Notes (Signed)
 Noted.  If patient is comfortable, increase Crestor to to 40 mg daily for further LDL reduction.  Thanks MJP

## 2023-10-16 NOTE — Progress Notes (Signed)
 Continue 2 tablets for now.  Check lipid panel in 11/2023, unless recommended otherwise by Pharm.D. at upcoming visit.  Thanks MJP

## 2023-10-23 ENCOUNTER — Ambulatory Visit: Admitting: Physician Assistant

## 2023-11-01 ENCOUNTER — Ambulatory Visit: Attending: Cardiology | Admitting: Pharmacist

## 2023-11-01 VITALS — Ht 63.0 in | Wt 223.2 lb

## 2023-11-01 DIAGNOSIS — G4733 Obstructive sleep apnea (adult) (pediatric): Secondary | ICD-10-CM

## 2023-11-01 DIAGNOSIS — E669 Obesity, unspecified: Secondary | ICD-10-CM

## 2023-11-01 MED ORDER — ROSUVASTATIN CALCIUM 40 MG PO TABS: 40.0000 mg | ORAL_TABLET | Freq: Every day | ORAL | 3 refills | Status: AC

## 2023-11-01 NOTE — Progress Notes (Addendum)
 Patient ID: Melissa Dillon                 DOB: 24-Feb-1950                    MRN: [id number]     HPI: Melissa Dillon is a 73 y.o. female patient referred to pharmacy clinic by Melissa Lawrence, MD to initiate GLP1-RA therapy. PMH is significant for  hyperlipidemia, OSA on CPAP, PAF, AIVR, NSVT, and obesity. Most recent BMI 39.55 kg/m .  PCP previously recommended starting a GLP1 for weight loss. Insurance denied coverage at the time.  Previously in a weight loss study at [facility]. She was apart of the diet food group of the study and lost ~30lb, but gained it all back when the trial ended.   Barriers to weight loss is depression. Will find herself eating when experiencing episodes. Currently in therapy and on medications to help with managing her depression.  Baseline weight and BMI: 38.63 kg/m  Current weight and BMI: 39.55 kg/m   Diet: 2-3 meals a day Breakfast: cereal (Rasin Bran, Cheerios, Shredded Wheat), toast Lunch: microwave meals Dinner: leftovers from the meal before, cereal, soup - something easy to make Eats out occasionally - [place], [employer], [employer], [employer] Previously doing [employer] meals and [employer] Does not like to cook Snacking a lot: fruit (blueberries), Doritos, , M&Ms Drinks: water, half and half tea, loves Dr. Nunzio (cut down), 2 times a week alcohol  Exercise: does not like going to the gym Typically walks about 10,000 steps 2-3 days a week. Does not walk as much as she would like  Family History:  Family History  Problem Relation Age of Onset   Multiple myeloma Mother    Hypertension Father    Hyperlipidemia Father    Throat cancer Father    Stroke Father    Migraines Father    Atrial fibrillation Father    Heart failure Father    Atrial fibrillation Sister    Skin cancer Sister    GER disease Sister    Osteopenia Sister     Social History: No tobacco, + ETOH  Labs: No results found for: HGBA1C  Wt Readings from  Last 1 Encounters:  11/01/23 223 lb 3.2 oz (101.2 kg)    BP Readings from Last 1 Encounters:  09/20/23 108/65   Pulse Readings from Last 1 Encounters:  09/20/23 71    No results found for: CHOL, TRIG, HDL, CHOLHDL, VLDL, LDLCALC, LDLDIRECT  Past Medical History:  Diagnosis Date   High cholesterol    Hypertension    Hyperthyroidism determined by thyroid function test     Current Outpatient Medications on File Prior to Visit  Medication Sig Dispense Refill   albuterol (VENTOLIN HFA) 108 (90 Base) MCG/ACT inhaler Inhale 1-2 puffs into the lungs every 6 (six) hours as needed.     apixaban  (ELIQUIS ) 5 MG TABS tablet Take 1 tablet (5 mg total) by mouth 2 (two) times daily. 60 tablet 3   apixaban  (ELIQUIS ) 5 MG TABS tablet Take 1 tablet (5 mg total) by mouth 2 (two) times daily. 28 tablet 0   Ascorbic Acid (VITAMIN C) 500 MG CHEW daily at 6 (six) AM.     azelastine (ASTELIN) 0.1 % nasal spray Place 2 sprays into both nostrils 2 (two) times daily. As needed     buPROPion (WELLBUTRIN XL) 300 MG 24 hr tablet Take by mouth.     cholecalciferol (VITAMIN D3) 25  MCG (1000 UNIT) tablet Take 1,000 Units by mouth daily.     escitalopram (LEXAPRO) 5 MG tablet Take 5 mg by mouth daily.     fluticasone (FLONASE) 50 MCG/ACT nasal spray Place 1 spray into both nostrils as needed for allergies.     latanoprost (XALATAN) 0.005 % ophthalmic solution INSTILL 1 DROP IN EACH EYE EVERY NIGHT AT BEDTIME     levocetirizine (XYZAL ALLERGY 24HR) 5 MG tablet as needed for allergies.     Lifitegrast (XIIDRA) 5 % SOLN Apply to eye.     metoprolol  succinate (TOPROL  XL) 25 MG 24 hr tablet Take 0.5 tablets (12.5 mg total) by mouth daily. 45 tablet 3   olmesartan (BENICAR) 5 MG tablet Take 2 tablets by mouth daily.     Olopatadine HCl 0.2 % SOLN 1 drop as needed (ALLERGIES).     omeprazole (PRILOSEC) 40 MG capsule      promethazine -dextromethorphan (PROMETHAZINE -DM) 6.25-15 MG/5ML syrup Take 5 mLs by  mouth 4 (four) times daily as needed. 100 mL 0   rosuvastatin  (CRESTOR ) 20 MG tablet Take by mouth.     No current facility-administered medications on file prior to visit.    Allergies  Allergen Reactions   Clonidine Derivatives Itching and Other (See Comments)    Dry mouth   Codeine Itching, Other (See Comments) and Rash    Unknown  Childhood  Unknown  Childhood  Unknown    Childhood    Unknown Childhood     Assessment/Plan:  1. Weight loss - Patient has not met goal of at least 5% of body weight loss with comprehensive lifestyle modifications alone in the past 3-6 months. Pharmacotherapy is appropriate to pursue as augmentation. Will start Zepbound pending sleep study AHI from [facility]. Patient will look into MyChart to find documentation and fax over.   Confirmed patient is not pregnant and denies personal or family history of medullary thyroid carcinoma (MTC) or Multiple Endocrine Neoplasia syndrome type 2 (MEN 2). Injection technique reviewed at today's visit. No hx of pancreatis or gallstones.  Advised patient on common side effects including nausea, diarrhea, dyspepsia, decreased appetite, and fatigue. Counseled patient on reducing meal size and how to titrate medication to minimize side effects. Counseled patient to call if intolerable side effects or if experiencing dehydration, abdominal pain, or dizziness. Along with pharmacotherapy, the patient will follow dietary modifications and aim for at least 150 minutes of moderate-intensity exercise per week, plus resistance training twice a week (as recommended by the American Heart Association). This resistance training--such as weightlifting, bodyweight exercises, or using resistance bands, adapted to the patient's ability--will help prevent muscle loss.  If patient can find sleep study and AHI is > 15, we will submit PA for Zepbound. If therapy is initiated, phone follow-ups will be conducted every 4 weeks for dose  titration until the patient reaches the effective therapeutic dose and target weight.   Melissa Dillon, PharmD PGY1 Pharmacy Resident [phone]  Melissa Dillon, Pharm.Melissa Dillon, CPP [facility] A [facility] of [facility] [address] [place], [place] [address]  Phone: [phone]; Fax: [phone]

## 2023-11-04 ENCOUNTER — Encounter: Payer: Self-pay | Admitting: Pharmacist

## 2023-11-05 ENCOUNTER — Other Ambulatory Visit (HOSPITAL_COMMUNITY): Payer: Self-pay

## 2023-11-05 NOTE — Telephone Encounter (Signed)
 Pharmacy Patient Advocate Encounter  Received notification from [employer] that Prior Authorization for ZEPBOUND has been APPROVED from 11/05/23 to 02/19/24. Ran test claim, Copay is $326.98 ([employer]). This test claim was processed through [facility]- copay amounts may vary at other pharmacies due to pharmacy/plan contracts, or as the patient moves through the different stages of their insurance plan.

## 2023-11-05 NOTE — Telephone Encounter (Signed)
 Pharmacy Patient Advocate Encounter   Received notification from Physician's Office that prior authorization for ZEPBOUND is required/requested.   Insurance verification completed.   The patient is insured through [facility] .   Per test claim: PA required; PA submitted to above mentioned insurance via Latent Key/confirmation #/EOC [id number] Status is pending

## 2023-12-04 ENCOUNTER — Other Ambulatory Visit: Payer: Self-pay | Admitting: Cardiology

## 2023-12-04 NOTE — Telephone Encounter (Signed)
 Refill Request.

## 2023-12-04 NOTE — Telephone Encounter (Signed)
 Prescription refill request for Eliquis  received. Indication:afib Last office visit:9/25 Scr:0.88  7/25 Age: 73 Weight:101.2  kg  Prescription refilled

## 2024-01-02 LAB — LAB REPORT - SCANNED
A1c: 5.8
EGFR: 61

## 2024-01-06 ENCOUNTER — Encounter: Payer: Self-pay | Admitting: Pharmacist

## 2024-02-21 ENCOUNTER — Encounter: Payer: Self-pay | Admitting: Pharmacist

## 2024-02-25 ENCOUNTER — Telehealth: Payer: Self-pay | Admitting: Pharmacy Technician

## 2024-02-25 ENCOUNTER — Other Ambulatory Visit (HOSPITAL_COMMUNITY): Payer: Self-pay

## 2024-02-25 NOTE — Telephone Encounter (Signed)
 Pharmacy Patient Advocate Encounter  Received notification from [employer] that Prior Authorization for ZEPBOUND has been APPROVED from 02/20/24 to 02/18/25. Ran test claim, Copay is $635.75. This test claim was processed through [facility]- copay amounts may vary at other pharmacies due to pharmacy/plan contracts, or as the patient moves through the different stages of their insurance plan.   PA #/Case ID/Reference #: [id number]

## 2024-02-25 NOTE — Telephone Encounter (Signed)
" ° °  Pharmacy Patient Advocate Encounter   Received notification from Melissa that prior authorization for zepbound is required/requested.   Insurance verification completed.   The patient is insured through [employer].   Per test claim: PA required; PA submitted to above mentioned insurance via Latent Key/confirmation #/EOC [id number] Status is pending  "

## 2024-04-16 ENCOUNTER — Ambulatory Visit: Admitting: Cardiology
